# Patient Record
Sex: Male | Born: 1980 | Race: White | Hispanic: No | Marital: Single | Smoking: Current every day smoker
Health system: Southern US, Community
[De-identification: ages and names within clinical notes are randomized; demographics above are authoritative.]

## PROBLEM LIST (undated history)

## (undated) DIAGNOSIS — K439 Ventral hernia without obstruction or gangrene: Secondary | ICD-10-CM

## (undated) DIAGNOSIS — K219 Gastro-esophageal reflux disease without esophagitis: Secondary | ICD-10-CM

## (undated) DIAGNOSIS — F419 Anxiety disorder, unspecified: Secondary | ICD-10-CM

---
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
## 2000-06-25 ENCOUNTER — Emergency Department (HOSPITAL_COMMUNITY): Admission: EM | Admit: 2000-06-25 | Discharge: 2000-06-25 | Payer: Self-pay | Admitting: *Deleted

## 2005-12-20 ENCOUNTER — Emergency Department (HOSPITAL_COMMUNITY): Admission: EM | Admit: 2005-12-20 | Discharge: 2005-12-20 | Payer: Self-pay | Admitting: Emergency Medicine

## 2006-11-18 ENCOUNTER — Emergency Department (HOSPITAL_COMMUNITY): Admission: EM | Admit: 2006-11-18 | Discharge: 2006-11-18 | Payer: Self-pay | Admitting: Emergency Medicine

## 2007-03-17 ENCOUNTER — Emergency Department (HOSPITAL_COMMUNITY): Admission: EM | Admit: 2007-03-17 | Discharge: 2007-03-17 | Payer: Self-pay | Admitting: Emergency Medicine

## 2007-04-01 ENCOUNTER — Emergency Department (HOSPITAL_COMMUNITY): Admission: EM | Admit: 2007-04-01 | Discharge: 2007-04-01 | Payer: Self-pay | Admitting: Emergency Medicine

## 2007-04-03 ENCOUNTER — Emergency Department (HOSPITAL_COMMUNITY): Admission: EM | Admit: 2007-04-03 | Discharge: 2007-04-03 | Payer: Self-pay | Admitting: Family Medicine

## 2010-05-10 ENCOUNTER — Observation Stay (HOSPITAL_COMMUNITY)
Admission: EM | Admit: 2010-05-10 | Discharge: 2010-05-10 | Payer: Self-pay | Source: Home / Self Care | Attending: Plastic Surgery | Admitting: Plastic Surgery

## 2010-05-11 NOTE — Op Note (Signed)
Ian Stokes, Ian Stokes              ACCOUNT NO.:  [id number]  MEDICAL RECORD NO.:  [id number]          PATIENT TYPE:  OBV  LOCATION:  2550                         FACILITY:  [facility]  PHYSICIAN:  Loreta Ave, MD DATE OF BIRTH:  01-06-1981  DATE OF PROCEDURE:  05/10/2010 DATE OF DISCHARGE:  05/10/2010                              OPERATIVE REPORT   SURGEON:  Loreta Ave, MD  ANESTHESIA:  General.  PREOPERATIVE DIAGNOSIS:  Right index finger laceration with tendon involvement.  POSTOPERATIVE DIAGNOSIS:  Right index finger laceration with tendon involvement, also laceration to the right long finger as well as laceration of the index finger radial digital nerve and both flexor digitorum profundus and flexor digitorum superficialis to the index finger, zone II.  PROCEDURE PERFORMED: 1. Exploration of penetrating wound, right index finger. 2. Repair of flexor digitorum profundus, zone II. 3. Excision of flexor digitorum superficialis. 4. Repair of radial digital nerve. 5. Repair of lacerations to the long finger and index finger.  TOURNIQUET TIME:  1 hour and 19 minutes at 250 mmHg.  ESTIMATED BLOOD LOSS:  10 mL.  IV FLUIDS:  Per Anesthesia.  URINE OUTPUT:  Not recorded.  CLINICAL INDICATION:  Ian Stokes is a 30 year old right-hand- dominant male who lacerated his right index finger on a machete today while clearing brush.  He came to the [facility] Emergency Room where he was seen and evaluated.  He was given a tetanus shot and I was asked to see him.  He had the loss of cascade of the right index finger and numbness on the radial side of the index fingertip.  I recommended exploration and repair of structures as needed.  The patient understood the risks of surgery to include but not be limited bleeding, infection, damage to nearby structures, stiffness, scarring, the need for hand therapy as well as potentially need for additional surgeries, and the  failure of any repairs made.  He desired to proceed.  DESCRIPTION OF OPERATION:  The patient was brought to the operating room and placed in a supine position on the operating room table.  After smooth and routine induction of general anesthesia, the right upper extremity had a well-padded pneumatic tourniquet placed on the arm.  The right upper extremity was prepped with Betadine scrub and paint and draped into a sterile field.  His sutures which had been placed by the emergency room staff on his index finger wound were removed.  The wound was explored proximally along the radial border of the index finger for approximately 2 cm and an extension was made along the ulnar border at the mid lateral line to the level of the DIP joint.  Skin flaps were elevated and the wound was evaluated.  It was irrigated copiously with normal saline.  Both flexor tendons were found to be lacerated and the radial digital nerve was found to be lacerated.  The radial digital vessel was controlled with bipolar cautery.  The finger was clearly inextreme flexion when it was lacerated as the distal stumps of the flexor digitorum superficialis were approximately 4 mm long.  For this reason and because of the small  size at this point, I decided to excise the flexor digitorum superficialis.  The flexor digitorum profundus was not locatable by probing the proximal portion of the wound.  Therefore, an incision was made overlying the A1 pulley along the course of the tendon sheath.  The skin was incised and superficial vessels were controlled with bipolar electrocautery.  A blunt dissection revealed the A1 pulley which was incised.  The flexor digitorum superficialis was withdrawn from this wound and transected with tenotomy scissors.  Next, the flexor digitorum profundus was sewn to an 8-French pediatric feeding tube and passed distally into the distal wound.  Next, the flexor digitorum profundus was repaired with a  3-0 Ethibond suture placed in locked cruciate fashion.  This was oversewn with a modified Kessler 3-0 Ethibond suture.  Next, the radial digital nerve was identified again and repaired with 3 interrupted 9-0 nylon sutures placed in epineural fashion.  The index finger was maintained in flexion and the long finger and index finger wounds were closed as well as the palmar wound with 4-0 interrupted chromic sutures.  A dry sterile dressing and Xeroform were applied as was the dorsal blocking splint.  The patient tolerated the procedure well, was extubated and transported to recovery room in stable condition.  Sponge and needle counts were reported as correct x2.     Loreta Ave, MD     CF/MEDQ  D:  05/10/2010  T:  05/11/2010  Job:  [id number]  Electronically Signed by Loreta Ave MD on 05/11/2010 03:19:29 PM

## 2011-01-27 LAB — POCT I-STAT CREATININE
Creatinine, Ser: 1.1
Operator id: 272551

## 2011-01-27 LAB — RAPID URINE DRUG SCREEN, HOSP PERFORMED
Amphetamines: NOT DETECTED
Barbiturates: NOT DETECTED
Benzodiazepines: NOT DETECTED
Cocaine: NOT DETECTED
Opiates: NOT DETECTED
Tetrahydrocannabinol: NOT DETECTED

## 2011-01-27 LAB — I-STAT 8, (EC8 V) (CONVERTED LAB)
Acid-Base Excess: 4 — ABNORMAL HIGH
BUN: 12
Bicarbonate: 31.2 — ABNORMAL HIGH
Chloride: 106
Glucose, Bld: 81
HCT: 48
Hemoglobin: 16.3
Operator id: 272551
Potassium: 4.4
Sodium: 141
TCO2: 33
pCO2, Ven: 57 — ABNORMAL HIGH
pH, Ven: 7.346 — ABNORMAL HIGH

## 2011-01-27 LAB — CBC
HCT: 45.3
Hemoglobin: 15.9
MCHC: 35.2
MCV: 88
Platelets: 210
RBC: 5.15
RDW: 13.9
WBC: 5.6

## 2011-01-27 LAB — DIFFERENTIAL
Basophils Absolute: 0
Basophils Relative: 0
Eosinophils Absolute: 0.2
Eosinophils Relative: 4
Lymphocytes Relative: 27
Lymphs Abs: 1.5
Monocytes Absolute: 0.5
Monocytes Relative: 8
Neutro Abs: 3.4
Neutrophils Relative %: 60

## 2011-01-27 LAB — ETHANOL: Alcohol, Ethyl (B): <5

## 2011-01-27 LAB — ACETAMINOPHEN LEVEL

## 2011-01-27 LAB — SALICYLATE LEVEL: Salicylate Lvl: <4

## 2019-04-04 ENCOUNTER — Telehealth: Payer: Self-pay | Admitting: *Deleted

## 2019-04-04 ENCOUNTER — Ambulatory Visit: Payer: Self-pay

## 2019-04-04 ENCOUNTER — Ambulatory Visit (INDEPENDENT_AMBULATORY_CARE_PROVIDER_SITE_OTHER): Payer: Self-pay | Admitting: Family Medicine

## 2019-04-04 ENCOUNTER — Encounter: Payer: Self-pay | Admitting: Family Medicine

## 2019-04-04 ENCOUNTER — Other Ambulatory Visit: Payer: Self-pay

## 2019-04-04 VITALS — BP 132/88 | HR 71 | Ht 71.5 in | Wt 189.0 lb

## 2019-04-04 DIAGNOSIS — R1032 Left lower quadrant pain: Secondary | ICD-10-CM

## 2019-04-04 DIAGNOSIS — K420 Umbilical hernia with obstruction, without gangrene: Secondary | ICD-10-CM

## 2019-04-04 NOTE — Telephone Encounter (Signed)
Appointment scheduled.

## 2019-04-04 NOTE — Patient Instructions (Addendum)
Thank you for coming in today. I do think its worth talking with the general surgeons about this.  [facility] will call you in the near future about referral.  Ok to schedule in Jan or Feb Recheck or contact me as needed.   If you want a primary care doctor the folks upstairs will be happy to see you.     Umbilical Hernia, Adult  A hernia is a bulge of tissue that pushes through an opening between muscles. An umbilical hernia happens in the abdomen, near the belly button (umbilicus). The hernia may contain tissues from the small intestine, large intestine, or fatty tissue covering the intestines (omentum). Umbilical hernias in adults tend to get worse over time, and they require surgical treatment. There are several types of umbilical hernias. You may have:  A hernia located just above or below the umbilicus (indirect hernia). This is the most common type of umbilical hernia in adults.  A hernia that forms through an opening formed by the umbilicus (direct hernia).  A hernia that comes and goes (reducible hernia). A reducible hernia may be visible only when you strain, lift something heavy, or cough. This type of hernia can be pushed back into the abdomen (reduced).  A hernia that traps abdominal tissue inside the hernia (incarcerated hernia). This type of hernia cannot be reduced.  A hernia that cuts off blood flow to the tissues inside the hernia (strangulated hernia). The tissues can start to die if this happens. This type of hernia requires emergency treatment. What are the causes? An umbilical hernia happens when tissue inside the abdomen presses on a weak area of the abdominal muscles. What increases the risk? You may have a greater risk of this condition if you:  Are obese.  Have had several pregnancies.  Have a buildup of fluid inside your abdomen (ascites).  Have had surgery that weakens the abdominal muscles. What are the signs or symptoms? The main  symptom of this condition is a painless bulge at or near the belly button. A reducible hernia may be visible only when you strain, lift something heavy, or cough. Other symptoms may include:  Dull pain.  A feeling of pressure. Symptoms of a strangulated hernia may include:  Pain that gets increasingly worse.  Nausea and vomiting.  Pain when pressing on the hernia.  Skin over the hernia becoming red or purple.  Constipation.  Blood in the stool. How is this diagnosed? This condition may be diagnosed based on:  A physical exam. You may be asked to cough or strain while standing. These actions increase the pressure inside your abdomen and force the hernia through the opening in your muscles. Your health care provider may try to reduce the hernia by pressing on it.  Your symptoms and medical history. How is this treated? Surgery is the only treatment for an umbilical hernia. Surgery for a strangulated hernia is done as soon as possible. If you have a small hernia that is not incarcerated, you may need to lose weight before having surgery. Follow these instructions at home:  Lose weight, if told by your health care provider.  Do not try to push the hernia back in.  Watch your hernia for any changes in color or size. Tell your health care provider if any changes occur.  You may need to avoid activities that increase pressure on your hernia.  Do not lift anything that is heavier than 10 lb (4.5 kg) until your health care provider  says that this is safe.  Take over-the-counter and prescription medicines only as told by your health care provider.  Keep all follow-up visits as told by your health care provider. This is important. Contact a health care provider if:  Your hernia gets larger.  Your hernia becomes painful. Get help right away if:  You develop sudden, severe pain near the area of your hernia.  You have pain as well as nausea or vomiting.  You have pain and the  skin over your hernia changes color.  You develop a fever. This information is not intended to replace advice given to you by your health care provider. Make sure you discuss any questions you have with your health care provider. Document Released: 08/31/2015 Document Revised: 05/13/2017 Document Reviewed: 09/29/2016 Elsevier Patient Education  2020 [employer].

## 2019-04-04 NOTE — Telephone Encounter (Signed)
Please call pt back to schedule an appt.

## 2019-04-04 NOTE — Progress Notes (Signed)
Subjective:    CC: L lower rib and abdomen pain  HPI: Pt is a 38 y/o male presenting w/ c/o L lower and midline abdominal pain that he feels might be hernias.  The most recent areas have been bothering him for about a week w/ no known MOI.  The most irritating area is the L lower rib cage/abdominal area.  He states that these painful areas are "squishy" to touch and will disappear upon supine positioning but reappear upon sitting/standing.  Pt rates his pain as a 3-4/10 and describes it as sharp in nature.  Pt is having radiating pain into his L lower back but denies any radiating pain into the pelvis and B LEs.  Pt has tried Advil.  He denies any irregularity w/ bowel and bladder function.  Past medical history, Surgical history, Family history not pertinant except as noted below, Social history, Allergies, and medications have been entered into the medical record, reviewed, and no changes needed.   Review of Systems: No headache, visual changes, nausea, vomiting, diarrhea, constipation, dizziness, abdominal pain, skin rash, fevers, chills, night sweats, weight loss, swollen lymph nodes, body aches, joint swelling, muscle aches, chest pain, shortness of breath, mood changes, visual or auditory hallucinations.   Objective:    Vitals:   04/04/19 1452  BP: 132/88  Pulse: 71  SpO2: 96%   General: Well Developed, well nourished, and in no acute distress.  Neuro/Psych: Alert and oriented x3, extra-ocular muscles intact, able to move all 4 extremities, sensation grossly intact. Skin: Warm and dry, no rashes noted.  Respiratory: Not using accessory muscles, speaking in full sentences, trachea midline.  Cardiovascular: Pulses palpable, no extremity edema. Abdomen: Does not appear distended.  Normal-appearing Area of tenderness with crepitation sensation at midline superior to umbilicus.   Palpable hernia reducible.  Abdomen otherwise normal to physical exam.  Lab and Radiology  Results  Limited skeletal/superficial soft tissue ultrasound abdomen Umbilicus visualized.  Normal abdominal musculature.  However just superior to the umbilicus defect and abdominal wall visible with up welling of peritoneal folds.  This was visualized dynamically ultrasound confirming hernia. Similar area visible just again superior to umbilicus along midline.  Total 2 hernias visible. Impression: Periumbilical hernia x2  Impression and Recommendations:    Assessment and Plan: 38 y.o. male with abdominal pain.  Patient has palpable and confirmed with ultrasound periumbilical hernia. Discussed options.  At this point reasonable to refer to general surgery for consultation for surgery.  Patient is self-pay and obviously money is a factor here.  However his symptoms are somewhat worsening over the last few weeks and likely would benefit from surgery sooner rather than later.  Recheck with back with me as needed for musculoskeletal issues.  Recommend follow-up with PCP for further primary care issues as needed.Marland Kitchen  PDMP not reviewed this encounter. Orders Placed This Encounter  Procedures  . [place] - LOWER Extremity - Limited - LEFT    Order Specific Question:   Reason for Exam (SYMPTOM  OR DIAGNOSIS REQUIRED)    Answer:   L lower quarter pain    Order Specific Question:   Preferred imaging location?    Answer:   [facility]  . Ambulatory referral to General Surgery    Referral Priority:   Routine    Referral Type:   Surgical    Referral Reason:   Specialty Services Required    Requested Specialty:   General Surgery    Number of Visits Requested:  1   No orders of the defined types were placed in this encounter.   Discussed warning signs or symptoms. Please see discharge instructions. Patient expresses understanding.   The above documentation has been reviewed and is accurate and complete Clementeen Graham

## 2019-04-05 ENCOUNTER — Other Ambulatory Visit: Payer: Self-pay

## 2019-04-05 ENCOUNTER — Emergency Department (HOSPITAL_COMMUNITY)
Admission: EM | Admit: 2019-04-05 | Discharge: 2019-04-06 | Disposition: A | Discharge status: Home / Self Care | Payer: Self-pay | Attending: Emergency Medicine | Admitting: Emergency Medicine

## 2019-04-05 ENCOUNTER — Ambulatory Visit: Payer: Self-pay | Admitting: Adult Health

## 2019-04-05 ENCOUNTER — Encounter (HOSPITAL_COMMUNITY): Payer: Self-pay

## 2019-04-05 DIAGNOSIS — F17290 Nicotine dependence, other tobacco product, uncomplicated: Secondary | ICD-10-CM | POA: Insufficient documentation

## 2019-04-05 DIAGNOSIS — K529 Noninfective gastroenteritis and colitis, unspecified: Secondary | ICD-10-CM | POA: Insufficient documentation

## 2019-04-05 DIAGNOSIS — R1013 Epigastric pain: Secondary | ICD-10-CM

## 2019-04-05 LAB — COMPREHENSIVE METABOLIC PANEL
ALT: 34 U/L (ref 0–44)
AST: 20 U/L (ref 15–41)
Albumin: 4.3 g/dL (ref 3.5–5.0)
Alkaline Phosphatase: 69 U/L (ref 38–126)
Anion gap: 10 (ref 5–15)
BUN: 15 mg/dL (ref 6–20)
CO2: 24 mmol/L (ref 22–32)
Calcium: 9.2 mg/dL (ref 8.9–10.3)
Chloride: 104 mmol/L (ref 98–111)
Creatinine, Ser: 0.84 mg/dL (ref 0.61–1.24)
GFR calc Af Amer: >60 mL/min (ref >60)
GFR calc non Af Amer: >60 mL/min (ref >60)
Glucose, Bld: 99 mg/dL (ref 70–99)
Potassium: 3.6 mmol/L (ref 3.5–5.1)
Sodium: 138 mmol/L (ref 135–145)
Total Bilirubin: 1.5 mg/dL — ABNORMAL HIGH (ref 0.3–1.2)
Total Protein: 7 g/dL (ref 6.5–8.1)

## 2019-04-05 LAB — CBC
HCT: 44.8 % (ref 39.0–52.0)
Hemoglobin: 15.4 g/dL (ref 13.0–17.0)
MCH: 31 pg (ref 26.0–34.0)
MCHC: 34.4 g/dL (ref 30.0–36.0)
MCV: 90.1 fL (ref 80.0–100.0)
Platelets: 193 10*3/uL (ref 150–400)
RBC: 4.97 MIL/uL (ref 4.22–5.81)
RDW: 12.7 % (ref 11.5–15.5)
WBC: 7.4 10*3/uL (ref 4.0–10.5)
nRBC: 0 % (ref 0.0–0.2)

## 2019-04-05 LAB — URINALYSIS, ROUTINE W REFLEX MICROSCOPIC
Bilirubin Urine: NEGATIVE
Glucose, UA: NEGATIVE mg/dL
Hgb urine dipstick: NEGATIVE
Ketones, ur: NEGATIVE mg/dL
Leukocytes,Ua: NEGATIVE
Nitrite: NEGATIVE
Protein, ur: NEGATIVE mg/dL
Specific Gravity, Urine: 1.013 (ref 1.005–1.030)
pH: 6 (ref 5.0–8.0)

## 2019-04-05 LAB — LIPASE, BLOOD: Lipase: 43 U/L (ref 11–51)

## 2019-04-05 MED ORDER — SODIUM CHLORIDE 0.9% FLUSH: 3.0000 mL | Freq: Once | INTRAVENOUS | Status: AC

## 2019-04-05 NOTE — Telephone Encounter (Signed)
Pt reports saw Dr Dorena Bodo yesterday, "Umbilical hernia." States pain worsening today, left lower abdomen, "Below rib cage. States severe shooting, worse with movement, bending, deep breaths. Denies fever, no nausea, vomiting. Directed to ED. Pt states will follow disposition.   Reason for Disposition . [1] SEVERE pain (e.g., excruciating) AND [2] present > 1 hour  Answer Assessment - Initial Assessment Questions 1. LOCATION: "Where does it hurt?"     Left lower, bottom of rib area 2. RADIATION: "Does the pain shoot anywhere else?" (e.g., chest, back)     no 3. ONSET: "When did the pain begin?" (Minutes, hours or days ago)      Seen yesterday, "Umbilical hernia" 4. SUDDEN: "Gradual or sudden onset?"     gradual 5. PATTERN "Does the pain come and go, or is it constant?"    - If constant: "Is it getting better, staying the same, or worsening?"      (Note: Constant means the pain never goes away completely; most serious pain is constant and it progresses)     - If intermittent: "How long does it last?" "Do you have pain now?"     (Note: Intermittent means the pain goes away completely between bouts)     Constant and shooting 6. SEVERY: "How bad is the pain?"  (e.g., Scale 1-10; mild, moderate, or severe)    - MILD (1-3): doesn't interfere with normal activities, abdomen soft and not tender to touch     - MODERATE (4-7): interferes with normal activities or awakens from sleep, tender to touch     - SEVERE (8-10): excruciating pain, doubled over, unable to do any normal activities       severe 7. RECURRENT SYMPTOM: "Have you ever had this type of abdominal pa     8. CAUSE: "What do you think is causing the abdominal pain?"in before?" If so, ask: "When was the last time?" and "What happened that time?"       hernia 9. RELIEVING/AGGRAVATING FACTORS: "What makes it better or worse?" (e.g., movement, antacids, bowel movement)     Worse with movement, bending, deep breaths 10. OTHER SYMPTOMS:  "Has there been any vomiting, diarrhea, constipation, or urine problems?"       no  Protocols used: ABDOMINAL PAIN - MALE-A-AH

## 2019-04-05 NOTE — ED Provider Notes (Signed)
[facility] DEPT Provider Note   CSN: [id number] Arrival date & time: 04/05/19  1858     History Chief Complaint  Patient presents with  . Abdominal Pain    Ian Stokes is a 38 y.o. male.  HPI   38 year old male with abdominal pain.  Pain is in left lower quadrant.  Dull achy.  Worse with deep breaths and certain movements.  No cough.  No respiratory complaints.  No urinary complaints.  Past history of kidney stones but says that previous symptoms were with back pain and had pain like this.  No acute change in bowel movements.  No fever or chills.  Denies any significant NSAID usage.  Denies alcohol.  Reports a history of "ulcers" but states he does not take medication regularly.  Surgical history is significant for appendectomy.  History reviewed. No pertinent past medical history.  There are no problems to display for this patient.   History reviewed. No pertinent surgical history.     History reviewed. No pertinent family history.  Social History   Tobacco Use  . Smoking status: Current Every Day Smoker    Types: E-cigarettes  . Smokeless tobacco: Never Used  Substance Use Topics  . Alcohol use: Not on file  . Drug use: Not on file    Home Medications Prior to Admission medications   Not on File    Allergies    Patient has no known allergies.  Review of Systems   Review of Systems All systems reviewed and negative, other than as noted in HPI.  Physical Exam Updated Vital Signs BP (!) 117/94 (BP Location: Left Arm)   Pulse 63   Temp 98.2 F (36.8 C) (Oral)   Resp 18   SpO2 97%   Physical Exam Vitals and nursing note reviewed.  Constitutional:      General: He is not in acute distress.    Appearance: He is well-developed.  HENT:     Head: Normocephalic and atraumatic.  Eyes:     General:        Right eye: No discharge.        Left eye: No discharge.     Conjunctiva/sclera: Conjunctivae normal.    Cardiovascular:     Rate and Rhythm: Normal rate and regular rhythm.     Heart sounds: Normal heart sounds. No murmur. No friction rub. No gallop.   Pulmonary:     Effort: Pulmonary effort is normal. No respiratory distress.     Breath sounds: Normal breath sounds.  Abdominal:     General: There is no distension.     Palpations: Abdomen is soft.     Tenderness: There is abdominal tenderness.     Comments: Epigastric tenderness without rebound or guarding.  No distention.  Musculoskeletal:        General: No tenderness.     Cervical back: Neck supple.  Skin:    General: Skin is warm and dry.  Neurological:     Mental Status: He is alert.  Psychiatric:        Behavior: Behavior normal.        Thought Content: Thought content normal.     ED Results / Procedures / Treatments   Labs (all labs ordered are listed, but only abnormal results are displayed) Labs Reviewed  COMPREHENSIVE METABOLIC PANEL - Abnormal; Notable for the following components:      Result Value   Total Bilirubin 1.5 (*)    All other components  within normal limits  LIPASE, BLOOD  CBC  URINALYSIS, ROUTINE W REFLEX MICROSCOPIC    EKG None  Radiology [place] - LOWER Extremity - Limited - LEFT  Result Date: 04/05/2019 Limited skeletal/superficial soft tissue ultrasound abdomen Umbilicus visualized.  Normal abdominal musculature.  However just superior to the umbilicus defect and abdominal wall visible with up welling of peritoneal folds.  This was visualized dynamically ultrasound confirming hernia. Similar area visible just again superior to umbilicus along midline.  Total 2 hernias visible. Impression: Periumbilical hernia x2   Procedures Procedures (including critical care time)  Medications Ordered in ED Medications  sodium chloride flush (NS) 0.9 % injection 3 mL (3 mLs Intravenous Not Given 04/05/19 2256)    ED Course  I have reviewed the triage vital signs and the nursing notes.  Pertinent labs  & imaging results that were available during my care of the patient were reviewed by me and considered in my medical decision making (see chart for details).    MDM Rules/Calculators/A&P                     38 year old male with abdominal pain.  Tender in the epigastrium.  Afebrile.  Generally well-appearing.  No peritoneal signs.  No obstructive symptoms.  Labs fairly unremarkable.  PUD?  Lipase is normal.  Doubt that this is biliary colic.  Will CT abdomen pelvis.  Disposition pending these results.  If is negative, will be discharged with a trial of PPI and outpatient follow-up.  Final Clinical Impression(s) / ED Diagnoses Final diagnoses:  Epigastric pain    Rx / DC Orders ED Discharge Orders    None       Raeford Razor, MD 04/06/19 [phone]

## 2019-04-05 NOTE — ED Triage Notes (Signed)
Pt reports pain in his LUQ that started about a week ago. He states that it is worse with movement and feels like a pinch. Denies N/V. Denies urinary symptoms. A&Ox4. He reports a hx of umbilical hernias.

## 2019-04-06 ENCOUNTER — Encounter (HOSPITAL_COMMUNITY): Payer: Self-pay

## 2019-04-06 ENCOUNTER — Emergency Department (HOSPITAL_COMMUNITY): Payer: Self-pay

## 2019-04-06 MED ORDER — CIPROFLOXACIN HCL 500 MG PO TABS: 500.0000 mg | ORAL_TABLET | Freq: Two times a day (BID) | ORAL | 0 refills | Status: AC

## 2019-04-06 MED ORDER — SODIUM CHLORIDE (PF) 0.9 % IJ SOLN
INTRAMUSCULAR | Status: AC
  Filled 2019-04-06: qty 50

## 2019-04-06 MED ORDER — IOHEXOL 300 MG/ML  SOLN
100.0000 mL | Freq: Once | INTRAMUSCULAR | Status: AC | PRN
  Administered 2019-04-06: 01:00:00 100 mL via INTRAVENOUS

## 2019-04-06 MED ORDER — ONDANSETRON 4 MG PO TBDP: 4.0000 mg | ORAL_TABLET | Freq: Three times a day (TID) | ORAL | 0 refills | Status: AC | PRN

## 2019-04-06 MED ORDER — PANTOPRAZOLE SODIUM 20 MG PO TBEC: 20.0000 mg | DELAYED_RELEASE_TABLET | Freq: Two times a day (BID) | ORAL | 0 refills | Status: DC

## 2019-04-06 MED ORDER — CIPROFLOXACIN HCL 500 MG PO TABS
500.0000 mg | ORAL_TABLET | Freq: Once | ORAL | Status: AC
  Administered 2019-04-06: 02:00:00 500 mg via ORAL
  Filled 2019-04-06: qty 1

## 2019-04-06 NOTE — ED Provider Notes (Signed)
I assumed care of this patient from Dr. Wilson Singer.  Please see their note for further details of Hx, PE.  Briefly patient is a 38 y.o. male who presented LUQ pain. Labs reassuring. Pending CT.  CT notable for mild descending colitis; possible infectious vs inflammatory.  Patient endorsed possible suspicious food intake (undercooked meat) recently.  Will treat for infectious process, but recommend GI follow up in case symptoms persist for IBD work up.   The patient appears reasonably screened and/or stabilized for discharge and I doubt any other medical condition or other [facility] requiring further screening, evaluation, or treatment in the ED at this time prior to discharge.  The patient is safe for discharge with strict return precautions.   The patient appears reasonably screened and/or stabilized for discharge and I doubt any other medical condition or other [facility] requiring further screening, evaluation, or treatment in the ED at this time prior to discharge.  Disposition: Discharge  Condition: Good  I have discussed the results, Dx and Tx plan with the patient who expressed understanding and agree(s) with the plan. Discharge instructions discussed at great length. The patient was given strict return precautions who verbalized understanding of the instructions. No further questions at time of discharge.    ED Discharge Orders         Ordered    pantoprazole (PROTONIX) 20 MG tablet  2 times daily before meals     04/06/19 0037    ciprofloxacin (CIPRO) 500 MG tablet  Every 12 hours     04/06/19 0157    ondansetron (ZOFRAN ODT) 4 MG disintegrating tablet  Every 8 hours PRN     04/06/19 0157          Follow Up: Ladene Artist, MD [address] [place] [address] [phone]  Schedule an appointment as soon as possible for a visit  in 1-2 weeks if pain does not improve with antibiotics         Emon Miggins, Grayce Sessions, MD 04/06/19 0200

## 2019-04-06 NOTE — ED Notes (Signed)
Patient transported to radiology

## 2019-04-07 ENCOUNTER — Ambulatory Visit: Payer: Self-pay | Admitting: Family Medicine

## 2019-05-04 ENCOUNTER — Ambulatory Visit: Payer: Self-pay | Admitting: General Surgery

## 2019-06-16 ENCOUNTER — Encounter (HOSPITAL_BASED_OUTPATIENT_CLINIC_OR_DEPARTMENT_OTHER): Payer: Self-pay | Admitting: General Surgery

## 2019-06-16 ENCOUNTER — Other Ambulatory Visit: Payer: Self-pay

## 2019-06-17 NOTE — Progress Notes (Signed)

## 2019-06-18 ENCOUNTER — Other Ambulatory Visit (HOSPITAL_COMMUNITY)
Admission: RE | Admit: 2019-06-18 | Discharge: 2019-06-18 | Disposition: A | Discharge status: Home / Self Care | Payer: HRSA Program | Source: Ambulatory Visit | Attending: General Surgery | Admitting: General Surgery

## 2019-06-18 DIAGNOSIS — Z20822 Contact with and (suspected) exposure to covid-19: Secondary | ICD-10-CM | POA: Insufficient documentation

## 2019-06-18 DIAGNOSIS — Z01812 Encounter for preprocedural laboratory examination: Secondary | ICD-10-CM | POA: Diagnosis present

## 2019-06-18 LAB — SARS CORONAVIRUS 2 (TAT 6-24 HRS): SARS Coronavirus 2: NEGATIVE

## 2019-06-21 NOTE — Anesthesia Preprocedure Evaluation (Addendum)
Anesthesia Evaluation  Patient identified by MRN, date of birth, ID band Patient awake    Reviewed: Allergy & Precautions, NPO status , Patient's Chart, lab work & pertinent test results  Airway Mallampati: II  TM Distance: >3 FB Neck ROM: Full    Dental  (+) Dental Advisory Given   Pulmonary neg pulmonary ROS, Current Smoker,    Pulmonary exam normal breath sounds clear to auscultation       Cardiovascular negative cardio ROS Normal cardiovascular exam Rhythm:Regular Rate:Normal     Neuro/Psych PSYCHIATRIC DISORDERS Anxiety negative neurological ROS     GI/Hepatic GERD  ,(+)     substance abuse  marijuana use,   Endo/Other  negative endocrine ROS  Renal/GU negative Renal ROS     Musculoskeletal negative musculoskeletal ROS (+)   Abdominal   Peds  Hematology negative hematology ROS (+)   Anesthesia Other Findings Day of surgery medications reviewed with the patient.  Reproductive/Obstetrics                           Anesthesia Physical Anesthesia Plan  ASA: II  Anesthesia Plan: General   Post-op Pain Management:    Induction: Intravenous  PONV Risk Score and Plan: 3 and Ondansetron, Dexamethasone, Midazolam and Treatment may vary due to age or medical condition  Airway Management Planned: Oral ETT  Additional Equipment: None  Intra-op Plan:   Post-operative Plan: Extubation in OR  Informed Consent: I have reviewed the patients History and Physical, chart, labs and discussed the procedure including the risks, benefits and alternatives for the proposed anesthesia with the patient or authorized representative who has indicated his/her understanding and acceptance.     Dental advisory given  Plan Discussed with: CRNA  Anesthesia Plan Comments: (Pt endorses marijuana usage this morning. He does not appear intoxicated and otherwise without contraindication to proceeding with  surgery.)       Anesthesia Quick Evaluation

## 2019-06-22 ENCOUNTER — Ambulatory Visit (HOSPITAL_BASED_OUTPATIENT_CLINIC_OR_DEPARTMENT_OTHER)
Admission: RE | Admit: 2019-06-22 | Discharge: 2019-06-22 | Disposition: A | Discharge status: Home / Self Care | Payer: Self-pay | Attending: General Surgery | Admitting: General Surgery

## 2019-06-22 ENCOUNTER — Encounter (HOSPITAL_BASED_OUTPATIENT_CLINIC_OR_DEPARTMENT_OTHER): Payer: Self-pay | Admitting: General Surgery

## 2019-06-22 ENCOUNTER — Other Ambulatory Visit: Payer: Self-pay

## 2019-06-22 ENCOUNTER — Ambulatory Visit (HOSPITAL_BASED_OUTPATIENT_CLINIC_OR_DEPARTMENT_OTHER): Payer: Self-pay | Admitting: Anesthesiology

## 2019-06-22 ENCOUNTER — Encounter (HOSPITAL_BASED_OUTPATIENT_CLINIC_OR_DEPARTMENT_OTHER)
Admission: RE | Disposition: A | Discharge status: Home / Self Care | Payer: Self-pay | Source: Home / Self Care | Attending: General Surgery

## 2019-06-22 DIAGNOSIS — K529 Noninfective gastroenteritis and colitis, unspecified: Secondary | ICD-10-CM | POA: Insufficient documentation

## 2019-06-22 DIAGNOSIS — K439 Ventral hernia without obstruction or gangrene: Secondary | ICD-10-CM | POA: Insufficient documentation

## 2019-06-22 DIAGNOSIS — F17200 Nicotine dependence, unspecified, uncomplicated: Secondary | ICD-10-CM | POA: Insufficient documentation

## 2019-06-22 HISTORY — DX: Gastro-esophageal reflux disease without esophagitis: K21.9

## 2019-06-22 HISTORY — DX: Anxiety disorder, unspecified: F41.9

## 2019-06-22 HISTORY — DX: Ventral hernia without obstruction or gangrene: K43.9

## 2019-06-22 HISTORY — PX: VENTRAL HERNIA REPAIR: SHX424

## 2019-06-22 SURGERY — REPAIR, HERNIA, VENTRAL
Anesthesia: General | Site: Abdomen

## 2019-06-22 MED ORDER — HYDROMORPHONE HCL 1 MG/ML IJ SOLN
INTRAMUSCULAR | Status: AC
  Filled 2019-06-22: qty 0.5

## 2019-06-22 MED ORDER — BUPIVACAINE HCL (PF) 0.25 % IJ SOLN
INTRAMUSCULAR | Status: DC | PRN
  Administered 2019-06-22: 20 mL

## 2019-06-22 MED ORDER — PROPOFOL 10 MG/ML IV BOLUS
INTRAVENOUS | Status: DC | PRN
  Administered 2019-06-22: 200 mg via INTRAVENOUS

## 2019-06-22 MED ORDER — DEXAMETHASONE SODIUM PHOSPHATE 4 MG/ML IJ SOLN
INTRAMUSCULAR | Status: DC | PRN
  Administered 2019-06-22: 10 mg via INTRAVENOUS

## 2019-06-22 MED ORDER — MIDAZOLAM HCL 2 MG/2ML IJ SOLN
INTRAMUSCULAR | Status: AC
  Filled 2019-06-22: qty 2

## 2019-06-22 MED ORDER — ROCURONIUM BROMIDE 10 MG/ML (PF) SYRINGE
PREFILLED_SYRINGE | INTRAVENOUS | Status: AC
  Filled 2019-06-22: qty 10

## 2019-06-22 MED ORDER — HYDROMORPHONE HCL 1 MG/ML IJ SOLN
0.2500 mg | INTRAMUSCULAR | Status: DC | PRN
  Administered 2019-06-22: 10:00:00 0.5 mg via INTRAVENOUS

## 2019-06-22 MED ORDER — OXYCODONE HCL 5 MG/5ML PO SOLN: 5.0000 mg | Freq: Once | ORAL | Status: DC | PRN

## 2019-06-22 MED ORDER — SUGAMMADEX SODIUM 200 MG/2ML IV SOLN
INTRAVENOUS | Status: DC | PRN
  Administered 2019-06-22: 200 mg via INTRAVENOUS

## 2019-06-22 MED ORDER — ONDANSETRON HCL 4 MG/2ML IJ SOLN
INTRAMUSCULAR | Status: DC | PRN
  Administered 2019-06-22: 4 mg via INTRAVENOUS

## 2019-06-22 MED ORDER — ROCURONIUM BROMIDE 100 MG/10ML IV SOLN
INTRAVENOUS | Status: DC | PRN
  Administered 2019-06-22: 50 mg via INTRAVENOUS

## 2019-06-22 MED ORDER — FENTANYL CITRATE (PF) 100 MCG/2ML IJ SOLN
INTRAMUSCULAR | Status: AC
  Filled 2019-06-22: qty 2

## 2019-06-22 MED ORDER — CEFAZOLIN SODIUM-DEXTROSE 2-4 GM/100ML-% IV SOLN
INTRAVENOUS | Status: AC
  Filled 2019-06-22: qty 100

## 2019-06-22 MED ORDER — LIDOCAINE 2% (20 MG/ML) 5 ML SYRINGE
INTRAMUSCULAR | Status: DC | PRN
  Administered 2019-06-22: 100 mg via INTRAVENOUS

## 2019-06-22 MED ORDER — CELECOXIB 200 MG PO CAPS
200.0000 mg | ORAL_CAPSULE | ORAL | Status: AC
  Administered 2019-06-22: 200 mg via ORAL

## 2019-06-22 MED ORDER — CEFAZOLIN SODIUM-DEXTROSE 2-4 GM/100ML-% IV SOLN
2.0000 g | INTRAVENOUS | Status: AC
  Administered 2019-06-22: 2 g via INTRAVENOUS

## 2019-06-22 MED ORDER — ACETAMINOPHEN 500 MG PO TABS
1000.0000 mg | ORAL_TABLET | ORAL | Status: AC
  Administered 2019-06-22: 1000 mg via ORAL

## 2019-06-22 MED ORDER — LACTATED RINGERS IV SOLN: INTRAVENOUS | Status: DC

## 2019-06-22 MED ORDER — MIDAZOLAM HCL 5 MG/5ML IJ SOLN
INTRAMUSCULAR | Status: DC | PRN
  Administered 2019-06-22: 2 mg via INTRAVENOUS

## 2019-06-22 MED ORDER — GABAPENTIN 300 MG PO CAPS
ORAL_CAPSULE | ORAL | Status: AC
  Filled 2019-06-22: qty 1

## 2019-06-22 MED ORDER — GABAPENTIN 300 MG PO CAPS
300.0000 mg | ORAL_CAPSULE | ORAL | Status: AC
  Administered 2019-06-22: 300 mg via ORAL

## 2019-06-22 MED ORDER — BUPIVACAINE HCL (PF) 0.25 % IJ SOLN
INTRAMUSCULAR | Status: AC
  Filled 2019-06-22: qty 150

## 2019-06-22 MED ORDER — CELECOXIB 200 MG PO CAPS
ORAL_CAPSULE | ORAL | Status: AC
  Filled 2019-06-22: qty 1

## 2019-06-22 MED ORDER — PROMETHAZINE HCL 25 MG/ML IJ SOLN
6.2500 mg | INTRAMUSCULAR | Status: DC | PRN
  Administered 2019-06-22: 12.5 mg via INTRAVENOUS

## 2019-06-22 MED ORDER — FENTANYL CITRATE (PF) 100 MCG/2ML IJ SOLN
INTRAMUSCULAR | Status: DC | PRN
  Administered 2019-06-22: 100 ug via INTRAVENOUS

## 2019-06-22 MED ORDER — HYDROCODONE-ACETAMINOPHEN 5-325 MG PO TABS: 1.0000 | ORAL_TABLET | Freq: Four times a day (QID) | ORAL | 0 refills | Status: AC | PRN

## 2019-06-22 MED ORDER — ONDANSETRON HCL 4 MG/2ML IJ SOLN
INTRAMUSCULAR | Status: AC
  Filled 2019-06-22: qty 4

## 2019-06-22 MED ORDER — OXYCODONE HCL 5 MG PO TABS: 5.0000 mg | ORAL_TABLET | Freq: Once | ORAL | Status: DC | PRN

## 2019-06-22 MED ORDER — ACETAMINOPHEN 500 MG PO TABS
ORAL_TABLET | ORAL | Status: AC
  Filled 2019-06-22: qty 2

## 2019-06-22 MED ORDER — CHLORHEXIDINE GLUCONATE CLOTH 2 % EX PADS: 6.0000 | MEDICATED_PAD | Freq: Once | CUTANEOUS | Status: DC

## 2019-06-22 MED ORDER — PROMETHAZINE HCL 25 MG/ML IJ SOLN
INTRAMUSCULAR | Status: AC
  Filled 2019-06-22: qty 1

## 2019-06-22 MED ORDER — PROPOFOL 10 MG/ML IV BOLUS
INTRAVENOUS | Status: AC
  Filled 2019-06-22: qty 20

## 2019-06-22 MED ORDER — MEPERIDINE HCL 25 MG/ML IJ SOLN: 6.2500 mg | INTRAMUSCULAR | Status: DC | PRN

## 2019-06-22 SURGICAL SUPPLY — 38 items
ADH SKN CLS APL DERMABOND .7 (GAUZE/BANDAGES/DRESSINGS) ×1
APL PRP STRL LF DISP 70% ISPRP (MISCELLANEOUS) ×1
BLADE CLIPPER SURG (BLADE) ×2 IMPLANT
BLADE SURG 15 STRL LF DISP TIS (BLADE) ×1 IMPLANT
BLADE SURG 15 STRL SS (BLADE) ×3
CHLORAPREP W/TINT 26 (MISCELLANEOUS) ×3 IMPLANT
COVER BACK TABLE 60X90IN (DRAPES) ×3 IMPLANT
COVER MAYO STAND STRL (DRAPES) ×3 IMPLANT
DECANTER SPIKE VIAL GLASS SM (MISCELLANEOUS) ×3 IMPLANT
DERMABOND ADVANCED (GAUZE/BANDAGES/DRESSINGS) ×2
DERMABOND ADVANCED .7 DNX12 (GAUZE/BANDAGES/DRESSINGS) ×1 IMPLANT
DRAPE LAPAROTOMY TRNSV 102X78 (DRAPES) ×3 IMPLANT
DRAPE UTILITY XL STRL (DRAPES) ×3 IMPLANT
ELECT COATED BLADE 2.86 ST (ELECTRODE) ×3 IMPLANT
ELECT REM PT RETURN 9FT ADLT (ELECTROSURGICAL) ×3
ELECTRODE REM PT RTRN 9FT ADLT (ELECTROSURGICAL) ×1 IMPLANT
GLOVE BIO SURGEON STRL SZ 6.5 (GLOVE) ×1 IMPLANT
GLOVE BIO SURGEON STRL SZ7.5 (GLOVE) ×3 IMPLANT
GLOVE BIO SURGEONS STRL SZ 6.5 (GLOVE) ×1
GOWN STRL REUS W/ TWL LRG LVL3 (GOWN DISPOSABLE) ×2 IMPLANT
GOWN STRL REUS W/TWL LRG LVL3 (GOWN DISPOSABLE) ×6
NDL HYPO 25X1 1.5 SAFETY (NEEDLE) ×1 IMPLANT
NEEDLE HYPO 25X1 1.5 SAFETY (NEEDLE) ×3 IMPLANT
NS IRRIG 1000ML POUR BTL (IV SOLUTION) IMPLANT
PACK BASIN DAY SURGERY FS (CUSTOM PROCEDURE TRAY) ×3 IMPLANT
PENCIL SMOKE EVACUATOR (MISCELLANEOUS) ×3 IMPLANT
SLEEVE SCD COMPRESS KNEE MED (MISCELLANEOUS) ×2 IMPLANT
SPONGE LAP 18X18 RF (DISPOSABLE) ×3 IMPLANT
SUT MON AB 4-0 PC3 18 (SUTURE) ×3 IMPLANT
SUT NOVA NAB DX-16 0-1 5-0 T12 (SUTURE) ×2 IMPLANT
SUT NOVA NAB GS-21 1 T12 (SUTURE) IMPLANT
SUT SILK 3 0 SH 30 (SUTURE) IMPLANT
SUT VIC AB 3-0 54X BRD REEL (SUTURE) IMPLANT
SUT VIC AB 3-0 BRD 54 (SUTURE)
SUT VIC AB 3-0 SH 27 (SUTURE) ×6
SUT VIC AB 3-0 SH 27X BRD (SUTURE) ×2 IMPLANT
SYR CONTROL 10ML LL (SYRINGE) ×3 IMPLANT
TOWEL GREEN STERILE FF (TOWEL DISPOSABLE) ×6 IMPLANT

## 2019-06-22 NOTE — Transfer of Care (Signed)
Immediate Anesthesia Transfer of Care Note  Patient: Ian Stokes  Procedure(s) Performed: VENTRAL HERNIA REPAIR x2 (N/A Abdomen)  Patient Location: PACU  Anesthesia Type:General  Level of Consciousness: awake and patient cooperative  Airway & Oxygen Therapy: Patient Spontanous Breathing and Patient connected to face mask oxygen  Post-op Assessment: Report given to RN and Post -op Vital signs reviewed and stable  Post vital signs: Reviewed and stable  Last Vitals:  Vitals Value Taken Time  BP    Temp    Pulse 66 06/22/19 0919  Resp    SpO2 100 % 06/22/19 0919    Last Pain:  Vitals:   06/22/19 0704  TempSrc: Temporal  PainSc: 0-No pain      Patients Stated Pain Goal: 1 (06/22/19 0704)  Complications: No apparent anesthesia complications

## 2019-06-22 NOTE — Interval H&P Note (Signed)
History and Physical Interval Note:  06/22/2019 8:12 AM  Ian Stokes  has presented today for surgery, with the diagnosis of VENTRAL HERNIA.  The various methods of treatment have been discussed with the patient and family. After consideration of risks, benefits and other options for treatment, the patient has consented to  Procedure(s): VENTRAL HERNIA REPAIR (N/A) as a surgical intervention.  The patient's history has been reviewed, patient examined, no change in status, stable for surgery.  I have reviewed the patient's chart and labs.  Questions were answered to the patient's satisfaction.     [name]

## 2019-06-22 NOTE — Anesthesia Procedure Notes (Signed)
Procedure Name: Intubation Date/Time: 06/22/2019 8:33 AM Performed by: Rigby Desanctis, CRNA Pre-anesthesia Checklist: Patient identified, Emergency Drugs available, Suction available, Patient being monitored and Timeout performed Patient Re-evaluated:Patient Re-evaluated prior to induction Oxygen Delivery Method: Circle system utilized Preoxygenation: Pre-oxygenation with 100% oxygen Induction Type: IV induction Ventilation: Mask ventilation without difficulty Laryngoscope Size: Glidescope and 3 Grade View: Grade III Tube type: Oral Tube size: 8.0 mm Number of attempts: 1 Airway Equipment and Method: Stylet and Oral airway Placement Confirmation: ETT inserted through vocal cords under direct vision,  positive ETCO2 and breath sounds checked- equal and bilateral Secured at: 22 cm Tube secured with: Tape Dental Injury: Teeth and Oropharynx as per pre-operative assessment

## 2019-06-22 NOTE — H&P (Signed)
Ian Stokes  Location: [facility] Patient #: [id number] DOB: 17-Mar-1981 Single / Language: [place] / Race: White Male   History of Present Illness  The patient is a 39 year old male who presents with abdominal swelling. We are asked to see the patient in consultation by Dr. Janelle Floor to evaluate him for a ventral hernia. The patient is a 39 year old white male who began having left-sided abdominal pain about 2 weeks before Christmas. In retrospect he feels as though the pain began right after cleaning a fish tank not washing his hands. The pain worsened and he ended up going to the emergency department 2 days before Christmas where he got a CT scan which showed left sided colitis as well as a tiny ventral hernia just above the umbilicus with no intestinal involvement. He was treated with antibiotics and his abdominal pain seems to be improving significantly. He denies any fevers or chills. He denies any nausea or vomiting. He denies any diarrhea. He does report some discomfort at the bulge just above the umbilicus when he moves at times   Past Surgical History Appendectomy   Diagnostic Studies History  Colonoscopy  never  Allergies  No Known Drug Allergies   Medication History No Current Medications Medications Reconciled  Social History Caffeine use  Carbonated beverages. Illicit drug use  Uses daily. No alcohol use  Tobacco use  Current every day smoker.  Family History  Alcohol Abuse  Father, Mother. Kidney Disease  Mother.  Other Problems  Gastric Ulcer  Gastroesophageal Reflux Disease  Umbilical Hernia Repair  Ventral Hernia Repair     Review of Systems  General Not Present- Appetite Loss, Chills, Fatigue, Fever, Night Sweats, Weight Gain and Weight Loss. Skin Not Present- Change in Wart/Mole, Dryness, Hives, Jaundice, New Lesions, Non-Healing Wounds, Rash and Ulcer. HEENT Present- Wears glasses/contact lenses. Not Present-  Earache, Hearing Loss, Hoarseness, Nose Bleed, Oral Ulcers, Ringing in the Ears, Seasonal Allergies, Sinus Pain, Sore Throat, Visual Disturbances and Yellow Eyes. Respiratory Not Present- Bloody sputum, Chronic Cough, Difficulty Breathing, Snoring and Wheezing. Breast Not Present- Breast Mass, Breast Pain, Nipple Discharge and Skin Changes. Cardiovascular Not Present- Chest Pain, Difficulty Breathing Lying Down, Leg Cramps, Palpitations, Rapid Heart Rate, Shortness of Breath and Swelling of Extremities. Gastrointestinal Present- Abdominal Pain and Change in Bowel Habits. Not Present- Bloating, Bloody Stool, Chronic diarrhea, Constipation, Difficulty Swallowing, Excessive gas, Gets full quickly at meals, Hemorrhoids, Indigestion, Nausea, Rectal Pain and Vomiting. Male Genitourinary Not Present- Blood in Urine, Change in Urinary Stream, Frequency, Impotence, Nocturia, Painful Urination, Urgency and Urine Leakage. Musculoskeletal Not Present- Back Pain, Joint Pain, Joint Stiffness, Muscle Pain, Muscle Weakness and Swelling of Extremities. Neurological Not Present- Decreased Memory, Fainting, Headaches, Numbness, Seizures, Tingling, Tremor, Trouble walking and Weakness. Psychiatric Not Present- Anxiety, Bipolar, Change in Sleep Pattern, Depression, Fearful and Frequent crying. Endocrine Not Present- Cold Intolerance, Excessive Hunger, Hair Changes, Heat Intolerance, Hot flashes and New Diabetes. Hematology Not Present- Blood Thinners, Easy Bruising, Excessive bleeding, Gland problems, HIV and Persistent Infections.  Vitals  Weight: 185 lb Height: 71in Body Surface Area: 2.04 m Body Mass Index: 25.8 kg/m  Temp.: 98.27F(Tympanic)  Pulse: 83 (Regular)  BP: 122/70 (Sitting, Left Arm, Standard)       Physical Exam  General Mental Status-Alert. General Appearance-Consistent with stated age. Hydration-Well hydrated. Voice-Normal.  Head and Neck Head-normocephalic,  atraumatic with no lesions or palpable masses. Trachea-midline. Thyroid Gland Characteristics - normal size and consistency.  Eye Eyeball - Bilateral-Extraocular  movements intact. Sclera/Conjunctiva - Bilateral-No scleral icterus.  Chest and Lung Exam Chest and lung exam reveals -quiet, even and easy respiratory effort with no use of accessory muscles and on auscultation, normal breath sounds, no adventitious sounds and normal vocal resonance. Inspection Chest Wall - Normal. Back - normal.  Cardiovascular Cardiovascular examination reveals -normal heart sounds, regular rate and rhythm with no murmurs and normal pedal pulses bilaterally.  Abdomen Note: The abdomen is soft with minimal tenderness. There is a tiny bulge just above the umbilicus along the midline and an even smaller one about an inch above that. There is no sign of bowel obstruction. There are no surgical scars.   Neurologic Neurologic evaluation reveals -alert and oriented x 3 with no impairment of recent or remote memory. Mental Status-Normal.  Musculoskeletal Normal Exam - Left-Upper Extremity Strength Normal and Lower Extremity Strength Normal. Normal Exam - Right-Upper Extremity Strength Normal and Lower Extremity Strength Normal.  Lymphatic Head & Neck  General Head & Neck Lymphatics: Bilateral - Description - Normal. Axillary  General Axillary Region: Bilateral - Description - Normal. Tenderness - Non Tender. Femoral & Inguinal  Generalized Femoral & Inguinal Lymphatics: Bilateral - Description - Normal. Tenderness - Non Tender.    Assessment & Plan  COLITIS, INDETERMINATE (K52.3) VENTRAL HERNIA WITHOUT OBSTRUCTION OR GANGRENE (K43.9) Impression: The patient appears to have one and possibly 2 small supraumbilical hernias along the linea alba that likely have some preperitoneal fat bulging through. Because of the natural history of these hernias to enlarge and because they are causing  him some discomfort as it would be reasonable to have these fixed. He would also like to have this done. I have discussed them in detail the risk and benefits of the operation as well as some of the technical aspects and he understands and wishes to proceed. I think the likelihood is that the fascial defects are so small that mesh will not be indicated to repair these and they can be repaired with a couple permanent stitches. He also had an ill-defined colitis that was treated with what seems to be appropriate antibiotics. If his abdominal pain does not continue to improve then he may require a evaluation by a gastroenterologist. This patient encounter took 30 minutes today to perform the following: take history, perform exam, review outside records, interpret imaging, counsel the patient on their diagnosis and document encounter, findings & plan in the EHR

## 2019-06-22 NOTE — Op Note (Signed)
06/22/2019  9:08 AM  PATIENT:  Ian Stokes  39 y.o. male  PRE-OPERATIVE DIAGNOSIS:  VENTRAL HERNIA  POST-OPERATIVE DIAGNOSIS:  VENTRAL HERNIA  PROCEDURE:  Procedure(s): VENTRAL HERNIA REPAIR x2 (N/A)  SURGEON:  Surgeon(s) and Role:    * Griselda Miner, MD - Primary  PHYSICIAN ASSISTANT:   ASSISTANTS: none   ANESTHESIA:   local and general  EBL:  Minimal   BLOOD ADMINISTERED:none  DRAINS: none   LOCAL MEDICATIONS USED:  MARCAINE     SPECIMEN:  No Specimen  DISPOSITION OF SPECIMEN:  N/A  COUNTS:  YES  TOURNIQUET:  * No tourniquets in log *  DICTATION: .Dragon Dictation   After informed consent was obtained the patient was brought to the operating room and placed in the supine position on the operating table.  After adequate induction of general anesthesia the patient's abdomen was prepped with ChloraPrep, allowed to dry, and draped in usual sterile manner.  An appropriate timeout was performed.  The patient had 2 small hernias just above the umbilicus.  The area around this was infiltrated with quarter percent Marcaine.  A small vertically oriented incision was made overlying the 2 palpable bulges.  The incision was carried through the skin and subcutaneous tissue sharply with electrocautery until the fascia of the abdominal wall was encountered.  There were 2 small lobules of preperitoneal fat that appeared to be bulging through a slitlike defect in the linea alba.  The preperitoneal fat was excised sharply with the electrocautery.  The fascial defect at each site was only about 2 or 3 mm.  Each fascial defect was closed with 2 interrupted #1 Novafil stitches.  Once this was accomplished the fascial defect seem well repaired.  There were no other obvious fascial defects that I could appreciate on general inspection of the area.  The wound was then irrigated with saline.  The subcutaneous tissue was closed with a running 3-0 Vicryl stitch.  The skin was then closed with a  running 4-0 Monocryl subcuticular stitch.  Dermabond dressings were applied.  The patient tolerated the procedure well.  At the end of the case all needle sponge and instrument counts were correct.  The patient was then awakened and taken to recovery in stable condition.  PLAN OF CARE: Discharge to home after PACU  PATIENT DISPOSITION:  PACU - hemodynamically stable.   Delay start of Pharmacological VTE agent (>24hrs) due to surgical blood loss or risk of bleeding: not applicable

## 2019-06-22 NOTE — Anesthesia Postprocedure Evaluation (Signed)
Anesthesia Post Note  Patient: Ian Stokes  Procedure(s) Performed: VENTRAL HERNIA REPAIR x2 (N/A Abdomen)     Patient location during evaluation: PACU Anesthesia Type: General Level of consciousness: sedated and patient cooperative Pain management: pain level controlled Vital Signs Assessment: post-procedure vital signs reviewed and stable Respiratory status: spontaneous breathing Cardiovascular status: stable Anesthetic complications: no    Last Vitals:  Vitals:   06/22/19 1010 06/22/19 1044  BP:  138/89  Pulse: 62 74  Resp: 17 16  Temp:  36.7 C  SpO2: 95% 99%    Last Pain:  Vitals:   06/22/19 1044  TempSrc: Oral  PainSc: 2                  Lewie Loron

## 2019-06-22 NOTE — Discharge Instructions (Signed)
  Post Anesthesia Home Care Instructions  Activity: Get plenty of rest for the remainder of the day. A responsible individual must stay with you for 24 hours following the procedure.  For the next 24 hours, DO NOT: -Drive a car -Advertising copywriter -Drink alcoholic beverages -Take any medication unless instructed by your physician -Make any legal decisions or sign important papers.  Meals: Start with liquid foods such as gelatin or soup. Progress to regular foods as tolerated. Avoid greasy, spicy, heavy foods. If nausea and/or vomiting occur, drink only clear liquids until the nausea and/or vomiting subsides. Call your physician if vomiting continues. No Tylenol or NSAIDS (Ibuprofen, Advil) before 1:15pm today.  Special Instructions/Symptoms: Your throat may feel dry or sore from the anesthesia or the breathing tube placed in your throat during surgery. If this causes discomfort, gargle with warm salt water. The discomfort should disappear within 24 hours.  If you had a scopolamine patch placed behind your ear for the management of post- operative nausea and/or vomiting:  1. The medication in the patch is effective for 72 hours, after which it should be removed.  Wrap patch in a tissue and discard in the trash. Wash hands thoroughly with soap and water. 2. You may remove the patch earlier than 72 hours if you experience unpleasant side effects which may include dry mouth, dizziness or visual disturbances. 3. Avoid touching the patch. Wash your hands with soap and water after contact with the patch.

## 2019-06-23 ENCOUNTER — Encounter: Payer: Self-pay | Admitting: *Deleted

## 2020-08-24 IMAGING — CT CT ABD-PELV W/ CM
2 of 4 series · 15 of 46 positions shown, 17 images · IV contrast (OMNIPAQUE 300)
Comparison: None.

CLINICAL DATA: Left upper quadrant pain for 1 week, worse with
movement

EXAM:
CT ABDOMEN AND PELVIS WITH CONTRAST
TECHNIQUE: Multidetector CT imaging of the abdomen and pelvis was performed
using the standard protocol following bolus administration of
intravenous contrast.
CONTRAST:  100mL OMNIPAQUE IOHEXOL 300 MG/ML  SOLN

[Series 2: axial st · axial · 0.74mm/px · z∈[-615,-205]mm · 12 of 92 slices shown, 14 images]
[im 5/92  soft-tissue]
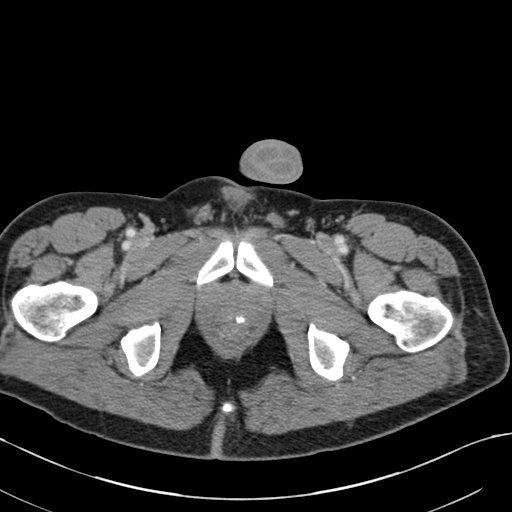
[im 5/92  bone]
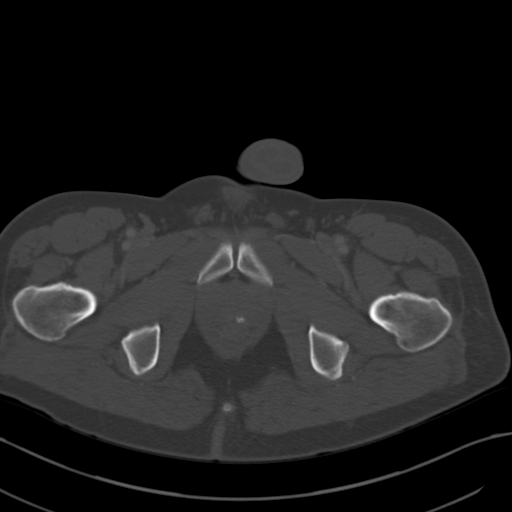
[im 14/92  soft-tissue]
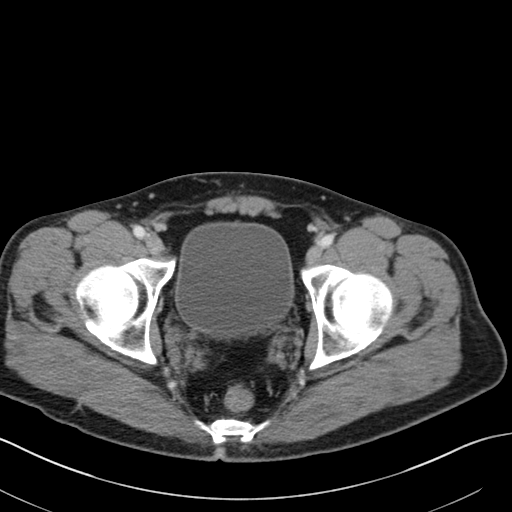
[im 19/92  soft-tissue]
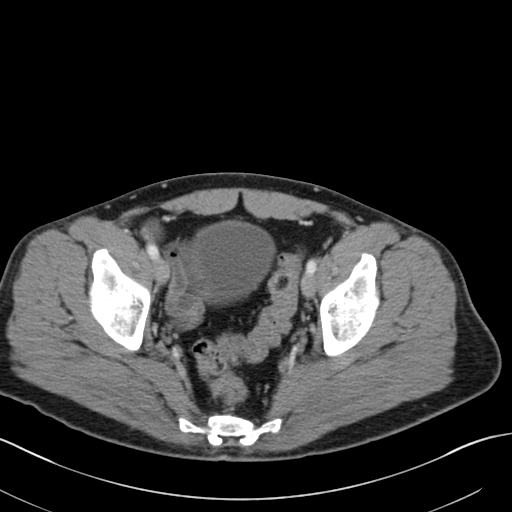
[im 28/92  soft-tissue]
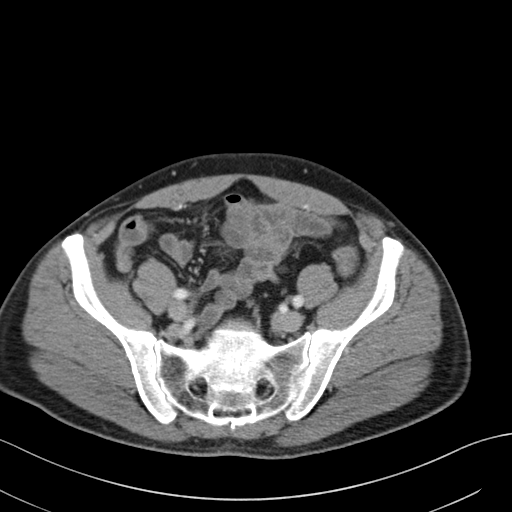
[im 37/92  soft-tissue]
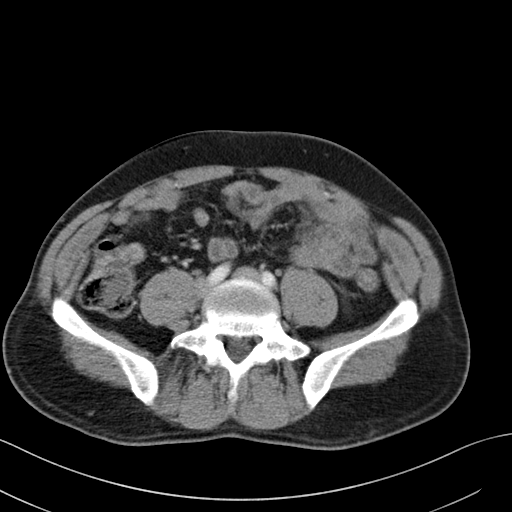
[im 41/92  soft-tissue]
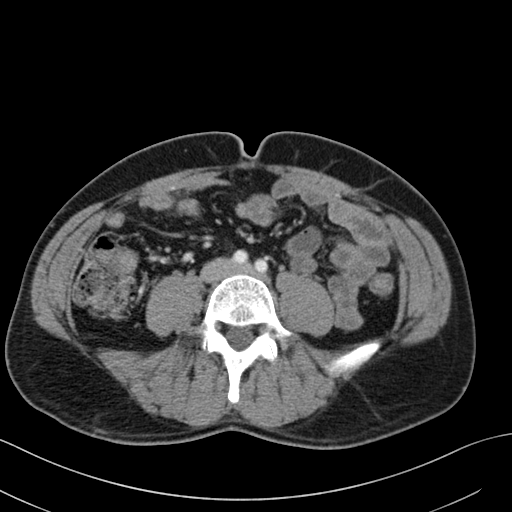
[im 51/92  soft-tissue]
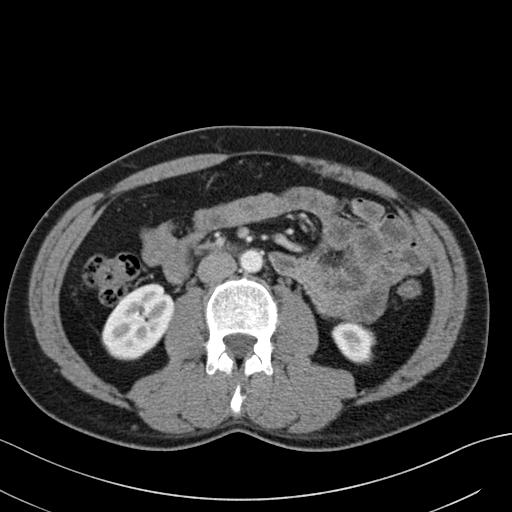
[im 55/92  soft-tissue]
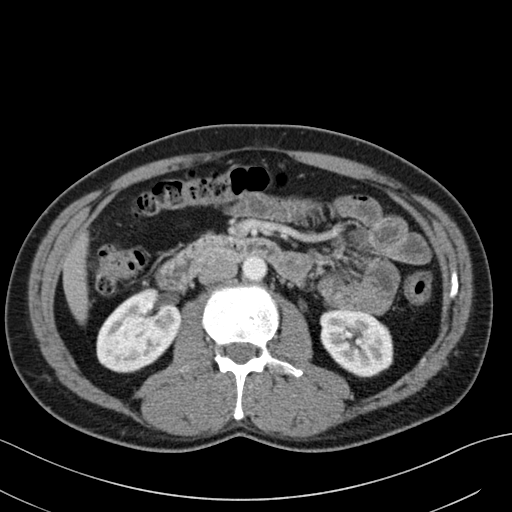
[im 64/92  soft-tissue]
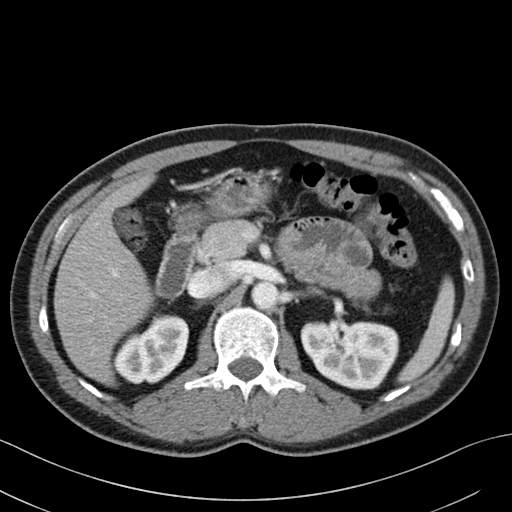
[im 64/92  bone]
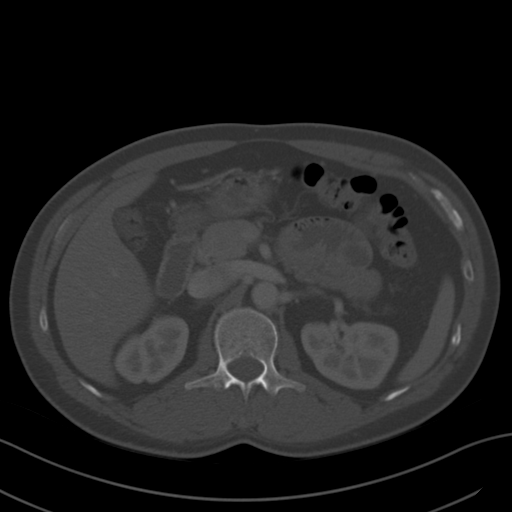
[im 73/92  soft-tissue]
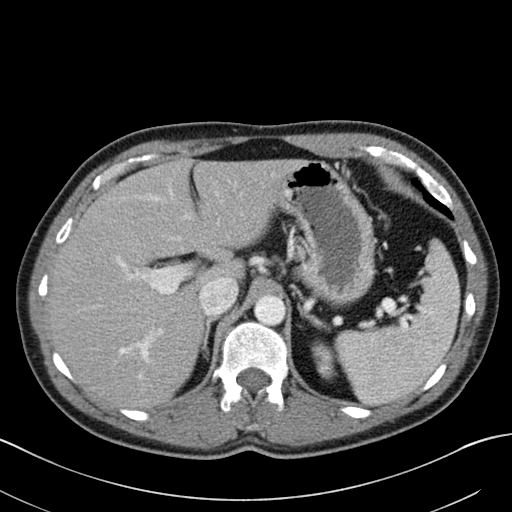
[im 78/92  soft-tissue]
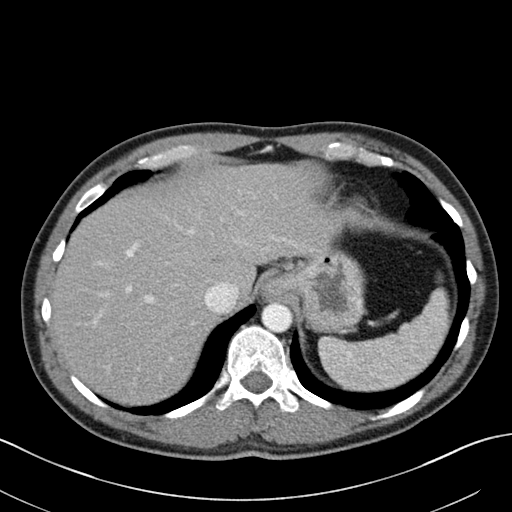
[im 87/92  soft-tissue]
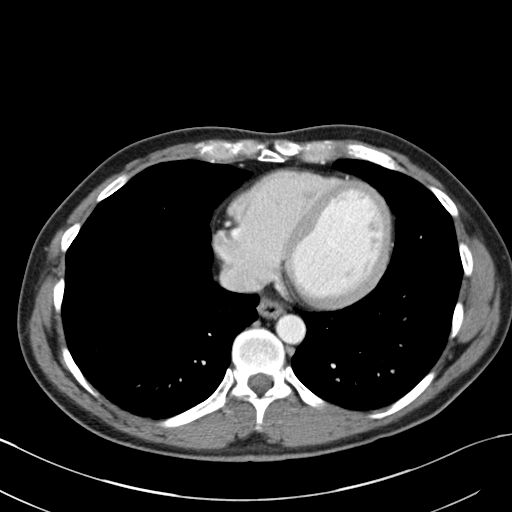

[Series 4: coronal st · coronal · 0.69mm/px · 3 of 121 slices shown]
[im 41/121  soft-tissue]
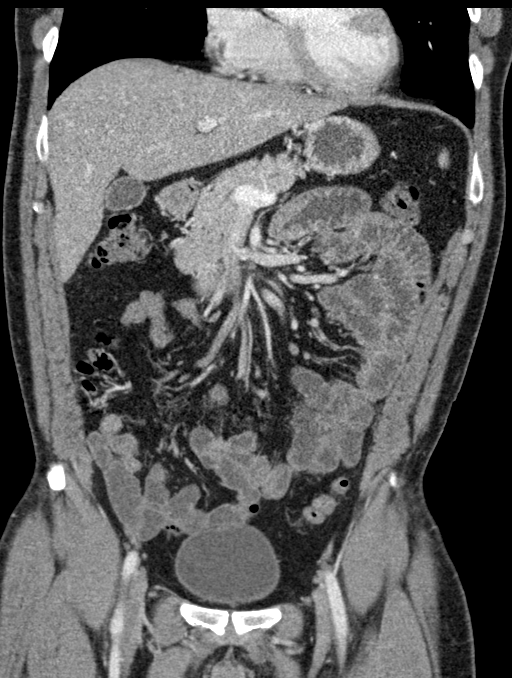
[im 54/121  soft-tissue]
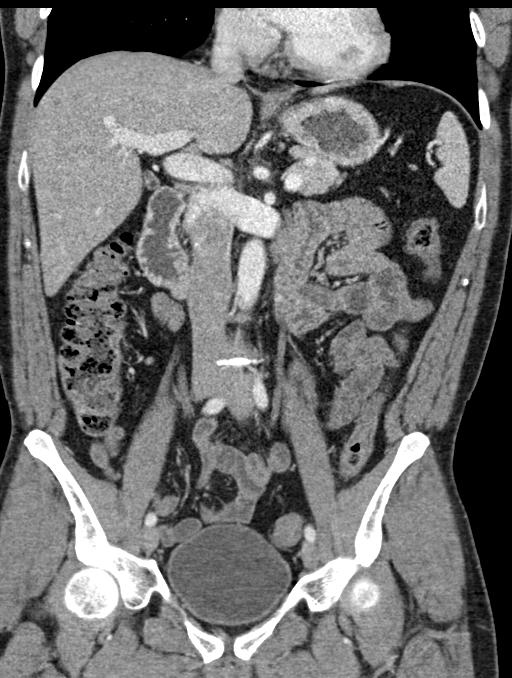
[im 67/121  soft-tissue]
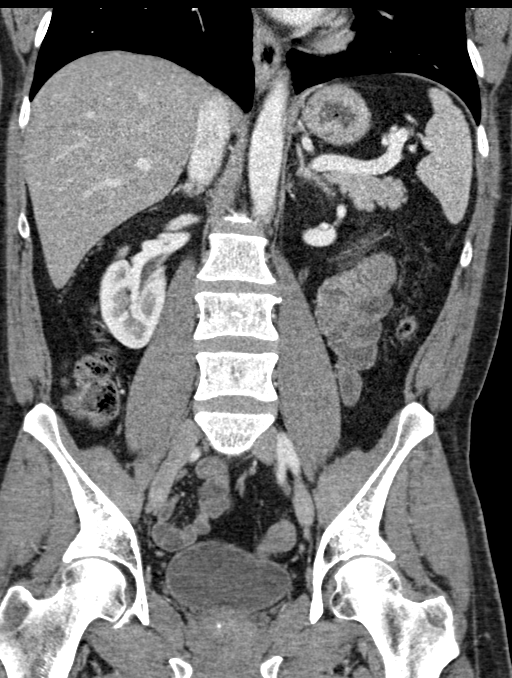

[15 of 46 positions shown; findings below may reference images not displayed]

FINDINGS: Lower chest: Some bandlike atelectatic changes or scarring is
present in the bases. Lung bases are otherwise clear. Normal heart
size. No pericardial effusion.

Hepatobiliary: No focal liver abnormality is seen. No gallstones,
gallbladder wall thickening, or biliary dilatation.

Pancreas: Unremarkable. No pancreatic ductal dilatation or
surrounding inflammatory changes.

Spleen: Normal in size without focal abnormality.

Adrenals/Urinary Tract: Adrenal glands are unremarkable. Kidneys are
normal, without renal calculi, focal lesion, or hydronephrosis.
Bladder is unremarkable.

Stomach/Bowel: Distal esophagus, stomach and duodenal sweep are
unremarkable. No small bowel wall thickening or dilatation. No
evidence of obstruction. Diminutive appendiceal stump noted at the
cecal tip. There is some questionable mild edematous segmental mural
thickening from the splenic flexure to the rectum. Minimal
pericolonic hazy stranding is noted. No extraluminal gas, free fluid
in the or organized collection.

Vascular/Lymphatic: The aorta is normal caliber. No suspicious or
enlarged lymph nodes in the included lymphatic chains.

Reproductive: Coarse eccentric calcification of the prostate. No
concerning abnormalities of the prostate or seminal vesicles.

Other: No abdominopelvic free fluid or free gas. No bowel containing
hernias. Small fat containing umbilical hernia without evidence of
strangulation.

Musculoskeletal: No acute osseous abnormality or suspicious osseous
lesion. Partial sacralization of the left L5 transverse process.
IMPRESSION: 1. Questionable mild edematous segmental mural thickening from the
splenic flexure to the rectum with minimal pericolonic hazy
stranding. Findings may represent a mild colitis of infectious or
inflammatory etiology.
2. No other acute abnormality in the abdomen or pelvis to provide
cause for patient's symptoms.
3. Small fat containing umbilical hernia without evidence of
strangulation.
